# Patient Record
Sex: Female | Born: 1940 | Race: Black or African American | Hispanic: No | Smoking: Never smoker
Health system: Southern US, Community
[De-identification: ages and names within clinical notes are randomized; demographics above are authoritative.]

## PROBLEM LIST (undated history)

## (undated) DIAGNOSIS — I10 Essential (primary) hypertension: Secondary | ICD-10-CM

## (undated) HISTORY — PX: BREAST LUMPECTOMY: SHX2

---

## 2013-09-26 ENCOUNTER — Encounter (HOSPITAL_COMMUNITY): Payer: Self-pay | Admitting: Emergency Medicine

## 2013-09-26 ENCOUNTER — Emergency Department (HOSPITAL_COMMUNITY): Payer: Self-pay

## 2013-09-26 ENCOUNTER — Emergency Department (HOSPITAL_COMMUNITY)
Admission: EM | Admit: 2013-09-26 | Discharge: 2013-09-27 | Disposition: A | Discharge status: Home / Self Care | Payer: Self-pay | Attending: Emergency Medicine | Admitting: Emergency Medicine

## 2013-09-26 DIAGNOSIS — Y9289 Other specified places as the place of occurrence of the external cause: Secondary | ICD-10-CM | POA: Insufficient documentation

## 2013-09-26 DIAGNOSIS — S060X1A Concussion with loss of consciousness of 30 minutes or less, initial encounter: Secondary | ICD-10-CM | POA: Insufficient documentation

## 2013-09-26 DIAGNOSIS — I10 Essential (primary) hypertension: Secondary | ICD-10-CM | POA: Insufficient documentation

## 2013-09-26 DIAGNOSIS — W010XXA Fall on same level from slipping, tripping and stumbling without subsequent striking against object, initial encounter: Secondary | ICD-10-CM | POA: Insufficient documentation

## 2013-09-26 DIAGNOSIS — S0990XA Unspecified injury of head, initial encounter: Secondary | ICD-10-CM | POA: Insufficient documentation

## 2013-09-26 DIAGNOSIS — Y9301 Activity, walking, marching and hiking: Secondary | ICD-10-CM | POA: Insufficient documentation

## 2013-09-26 DIAGNOSIS — S0993XA Unspecified injury of face, initial encounter: Secondary | ICD-10-CM | POA: Insufficient documentation

## 2013-09-26 DIAGNOSIS — S199XXA Unspecified injury of neck, initial encounter: Secondary | ICD-10-CM

## 2013-09-26 DIAGNOSIS — IMO0002 Reserved for concepts with insufficient information to code with codable children: Secondary | ICD-10-CM | POA: Insufficient documentation

## 2013-09-26 HISTORY — DX: Essential (primary) hypertension: I10

## 2013-09-26 LAB — CBC WITH DIFFERENTIAL/PLATELET
BASOS PCT: 0 % (ref 0–1)
Basophils Absolute: 0 10*3/uL (ref 0.0–0.1)
Eosinophils Absolute: 0 10*3/uL (ref 0.0–0.7)
Eosinophils Relative: 0 % (ref 0–5)
HCT: 36.3 % (ref 36.0–46.0)
Hemoglobin: 11.7 g/dL — ABNORMAL LOW (ref 12.0–15.0)
LYMPHS PCT: 16 % (ref 12–46)
Lymphs Abs: 2.3 10*3/uL (ref 0.7–4.0)
MCH: 26.7 pg (ref 26.0–34.0)
MCHC: 32.2 g/dL (ref 30.0–36.0)
MCV: 82.9 fL (ref 78.0–100.0)
MONO ABS: 0.8 10*3/uL (ref 0.1–1.0)
MONOS PCT: 6 % (ref 3–12)
NEUTROS ABS: 11.2 10*3/uL — AB (ref 1.7–7.7)
Neutrophils Relative %: 78 % — ABNORMAL HIGH (ref 43–77)
Platelets: 299 10*3/uL (ref 150–400)
RBC: 4.38 MIL/uL (ref 3.87–5.11)
RDW: 15.4 % (ref 11.5–15.5)
WBC: 14.4 10*3/uL — ABNORMAL HIGH (ref 4.0–10.5)

## 2013-09-26 LAB — COMPREHENSIVE METABOLIC PANEL
ALT: 14 U/L (ref 0–35)
ANION GAP: 16 — AB (ref 5–15)
AST: 17 U/L (ref 0–37)
Albumin: 4.1 g/dL (ref 3.5–5.2)
Alkaline Phosphatase: 92 U/L (ref 39–117)
BILIRUBIN TOTAL: 0.2 mg/dL — AB (ref 0.3–1.2)
BUN: 12 mg/dL (ref 6–23)
CHLORIDE: 100 meq/L (ref 96–112)
CO2: 25 meq/L (ref 19–32)
CREATININE: 0.71 mg/dL (ref 0.50–1.10)
Calcium: 9.7 mg/dL (ref 8.4–10.5)
GFR calc Af Amer: >90 mL/min (ref >90)
GFR, EST NON AFRICAN AMERICAN: 84 mL/min — AB (ref >90)
Glucose, Bld: 106 mg/dL — ABNORMAL HIGH (ref 70–99)
Potassium: 3 mEq/L — ABNORMAL LOW (ref 3.7–5.3)
Sodium: 141 mEq/L (ref 137–147)
Total Protein: 7.5 g/dL (ref 6.0–8.3)

## 2013-09-26 LAB — I-STAT TROPONIN, ED: Troponin i, poc: 0 ng/mL (ref 0.00–0.08)

## 2013-09-26 LAB — URINALYSIS, ROUTINE W REFLEX MICROSCOPIC
BILIRUBIN URINE: NEGATIVE
Glucose, UA: NEGATIVE mg/dL
HGB URINE DIPSTICK: NEGATIVE
KETONES UR: NEGATIVE mg/dL
NITRITE: NEGATIVE
Protein, ur: NEGATIVE mg/dL
Specific Gravity, Urine: 1.011 (ref 1.005–1.030)
UROBILINOGEN UA: 0.2 mg/dL (ref 0.0–1.0)
pH: 7 (ref 5.0–8.0)

## 2013-09-26 LAB — URINE MICROSCOPIC-ADD ON

## 2013-09-26 LAB — POC OCCULT BLOOD, ED: FECAL OCCULT BLD: NEGATIVE

## 2013-09-26 LAB — CBG MONITORING, ED: Glucose-Capillary: 101 mg/dL — ABNORMAL HIGH (ref 70–99)

## 2013-09-26 MED ORDER — ACETAMINOPHEN 325 MG PO TABS
650.0000 mg | ORAL_TABLET | Freq: Once | ORAL | Status: AC
  Administered 2013-09-26: 650 mg via ORAL
  Filled 2013-09-26: qty 2

## 2013-09-26 MED ORDER — POTASSIUM CHLORIDE CRYS ER 20 MEQ PO TBCR
40.0000 meq | EXTENDED_RELEASE_TABLET | Freq: Once | ORAL | Status: AC
  Administered 2013-09-26: 40 meq via ORAL
  Filled 2013-09-26: qty 2

## 2013-09-26 NOTE — ED Provider Notes (Signed)
  Physical Exam  BP 140/68  Pulse 68  Temp(Src) 98.7 F (37.1 C) (Oral)  Resp 18  SpO2 96%  Physical Exam  ED Course  Procedures  MDM Patient with mechanical fall, followed by syncope. She is back to her baseline now. Does have had trauma. Imaging pending at this time.      Juliet RudeNathan R. Rubin PayorPickering, MD 09/26/13 [phone]

## 2013-09-26 NOTE — ED Notes (Signed)
Patient transported to X-ray 

## 2013-09-26 NOTE — ED Provider Notes (Signed)
CSN: [id number]     Arrival date & time 09/26/13  1504 History   None    Chief Complaint  Patient presents with  . Fall  . Loss of Consciousness   Pam Miller is a 73 yo AAF w/PMH of HTN who presents today after mechanical fall and subsequent syncope. Patient lives outside walking with her daughter when she tripped over the curb fell onto her knees and struck her head on the cement. Patient did not lose consciousness at that point that when daughter helped her to stand and lean against a pillar she then syncopized. Patient believes she was only out for a couple of seconds. She felt very nauseous and sweaty after she fell the second time. Vomited once. EMS was called in route she had fecal incontinence. She now has knee pain as well as left forehead pain and headache. Lost part of her tooth in her dentures, did not swallow. Fragment was handed to daughter.   She denies CP, SOB, fever, chills, constipation, hematemesis, dysuria, hematuria, sick contacts, or recent illness or travel.   (Consider location/radiation/quality/duration/timing/severity/associated sxs/prior Treatment) Patient is a 73 y.o. female presenting with head injury. The history is provided by the patient.  Head Injury Location:  Frontal Mechanism of injury: fall   Pain details:    Quality:  Aching   Severity:  Moderate   Timing:  Constant   Progression:  Unchanged Chronicity:  New Associated symptoms: headache, loss of consciousness, nausea, neck pain and vomiting   Associated symptoms: no blurred vision, no difficulty breathing, no disorientation, no double vision and no focal weakness     Past Medical History  Diagnosis Date  . Hypertension    Past Surgical History  Procedure Laterality Date  . Breast lumpectomy     No family history on file. History  Substance Use Topics  . Smoking status: Never Smoker   . Smokeless tobacco: Not on file  . Alcohol Use: No   OB History   Grav Para Term Preterm Abortions  TAB SAB Ect Mult Living                 Review of Systems  Constitutional: Negative for fever and chills.  HENT: Positive for dental problem (lost tooth in dentures). Negative for nosebleeds.   Eyes: Negative for blurred vision and double vision.  Respiratory: Negative for shortness of breath.   Cardiovascular: Negative for chest pain, palpitations and leg swelling.  Gastrointestinal: Positive for nausea and vomiting. Negative for abdominal pain, diarrhea, constipation and abdominal distention.  Genitourinary: Negative for dysuria, frequency, flank pain and decreased urine volume.  Musculoskeletal: Positive for arthralgias (knees) and neck pain. Negative for back pain.  Skin: Positive for wound (forehead bleeding, knees hurt).  Neurological: Positive for loss of consciousness and headaches. Negative for dizziness, focal weakness, speech difficulty and light-headedness.  All other systems reviewed and are negative.     Allergies  Review of patient's allergies indicates not on file.  Home Medications   Prior to Admission medications   Not on File   BP 140/68  Pulse 68  Temp(Src) 98.7 F (37.1 C) (Oral)  Resp 18  SpO2 96% Physical Exam  Nursing note and vitals reviewed. Constitutional: She is oriented to person, place, and time. She appears well-developed and well-nourished. No distress.  HENT:  Head: Normocephalic and atraumatic.  Eyes: EOM are normal. Pupils are equal, round, and reactive to light.  Cardiovascular: Normal rate, regular rhythm, normal heart sounds and intact distal pulses.  Exam reveals no gallop and no friction rub.   No murmur heard. Pulmonary/Chest: Effort normal and breath sounds normal. No respiratory distress. She has no wheezes. She has no rales. She exhibits no tenderness.  Abdominal: Soft. Bowel sounds are normal. She exhibits no distension and no mass. There is no tenderness. There is no rebound and no guarding.  Musculoskeletal: She exhibits  tenderness (over both knees, small abrasion over left knee).  Lymphadenopathy:    She has no cervical adenopathy.  Neurological: She is alert and oriented to person, place, and time. No cranial nerve deficit. Coordination normal.  Skin: Skin is warm and dry. She is not diaphoretic.    ED Course  Procedures (including critical care time) Labs Review Labs Reviewed  URINALYSIS, ROUTINE W REFLEX MICROSCOPIC - Abnormal; Notable for the following:    Leukocytes, UA TRACE (*)    All other components within normal limits  CBC WITH DIFFERENTIAL - Abnormal; Notable for the following:    WBC 14.4 (*)    Hemoglobin 11.7 (*)    Neutrophils Relative % 78 (*)    Neutro Abs 11.2 (*)    All other components within normal limits  COMPREHENSIVE METABOLIC PANEL - Abnormal; Notable for the following:    Potassium 3.0 (*)    Glucose, Bld 106 (*)    Total Bilirubin 0.2 (*)    GFR calc non Af Amer 84 (*)    Anion gap 16 (*)    All other components within normal limits  CBG MONITORING, ED - Abnormal; Notable for the following:    Glucose-Capillary 101 (*)    All other components within normal limits  URINE MICROSCOPIC-ADD ON  POCT CBG (FASTING - GLUCOSE)-MANUAL ENTRY  I-STAT TROPOININ, ED  POC OCCULT BLOOD, ED    Imaging Review Dg Chest 1 View  09/26/2013   CLINICAL DATA:  Syncope.  EXAM: CHEST - 1 VIEW  COMPARISON:  None.  FINDINGS: Heart is borderline in size. Mediastinal contours are within normal limits. Lungs are clear. No effusions or acute bony abnormality.  IMPRESSION: No active disease.   Electronically Signed   By: Charlett NoseKevin  Dover M.D.   On: 09/26/2013 17:12   Dg Knee 2 Views Left  09/26/2013   CLINICAL DATA:  Evaluate left knee.  Fall.  Knee pain.  EXAM: LEFT KNEE - 1-2 VIEW  COMPARISON:  None.  FINDINGS: Moderate tricompartment degenerative changes with joint space narrowing and spurring. No acute bony abnormality. Specifically, no fracture, subluxation, or dislocation. Soft tissues are  intact. No joint effusion.  IMPRESSION: Osteoarthritis.  No acute findings.   Electronically Signed   By: Charlett NoseKevin  Dover M.D.   On: 09/26/2013 17:20   Dg Knee 2 Views Right  09/26/2013   CLINICAL DATA:  Status post fall.  Right knee pain.  EXAM: RIGHT KNEE - 1-2 VIEW  COMPARISON:  None.  FINDINGS: There is no acute bony or joint abnormality. Advanced tricompartmental osteoarthritis appears worst in medial compartment. There is no joint effusion.  IMPRESSION: No acute finding.  Advanced tricompartmental degenerative disease.   Electronically Signed   By: Drusilla Kannerhomas  Dalessio M.D.   On: 09/26/2013 17:14   Ct Head Wo Contrast  09/26/2013   CLINICAL DATA:  Syncopal episode. Abrasion to left frontal region. Headache.  EXAM: CT HEAD WITHOUT CONTRAST  CT CERVICAL SPINE WITHOUT CONTRAST  TECHNIQUE: Multidetector CT imaging of the head and cervical spine was performed following the standard protocol without intravenous contrast. Multiplanar CT image reconstructions of the cervical  spine were also generated.  COMPARISON:  None.  FINDINGS: CT HEAD FINDINGS  Sinuses/Soft tissues: Mild left supraorbital/frontal scalp soft tissue swelling.  Clear paranasal sinuses and mastoid air cells. No underlying skull fracture.  Intracranial: No mass lesion, hemorrhage, hydrocephalus, acute infarct, intra-axial, or extra-axial fluid collection.  CT CERVICAL SPINE FINDINGS  Spinal visualization through the bottom of T2. Prevertebral soft tissues are within normal limits. No apical pneumothorax.  Multilevel spondylosis. Bilateral foraminal narrowing at C5-6, C6-7. Bilateral carotid atherosclerosis.  Skull base intact. Maintenance of vertebral body height. Straightening and mild reversal of expected cervical lordosis, centered about the C5-6 level. Prominent endplate osteophytes at this level. Facets are well-aligned. Coronal reformats demonstrate a normal C1-C2 articulation.  IMPRESSION: 1. No acute intracranial abnormality. Soft tissue  swelling about the left frontal/supraorbital region. 2. Cervical spondylosis, without acute fracture or subluxation. 3. Straightening and mild reversal of expected cervical lordosis could be positional, due to muscular spasm, or ligamentous injury.   Electronically Signed   By: Jeronimo Greaves M.D.   On: 09/26/2013 16:54   Ct Cervical Spine Wo Contrast  09/26/2013   CLINICAL DATA:  Syncopal episode. Abrasion to left frontal region. Headache.  EXAM: CT HEAD WITHOUT CONTRAST  CT CERVICAL SPINE WITHOUT CONTRAST  TECHNIQUE: Multidetector CT imaging of the head and cervical spine was performed following the standard protocol without intravenous contrast. Multiplanar CT image reconstructions of the cervical spine were also generated.  COMPARISON:  None.  FINDINGS: CT HEAD FINDINGS  Sinuses/Soft tissues: Mild left supraorbital/frontal scalp soft tissue swelling.  Clear paranasal sinuses and mastoid air cells. No underlying skull fracture.  Intracranial: No mass lesion, hemorrhage, hydrocephalus, acute infarct, intra-axial, or extra-axial fluid collection.  CT CERVICAL SPINE FINDINGS  Spinal visualization through the bottom of T2. Prevertebral soft tissues are within normal limits. No apical pneumothorax.  Multilevel spondylosis. Bilateral foraminal narrowing at C5-6, C6-7. Bilateral carotid atherosclerosis.  Skull base intact. Maintenance of vertebral body height. Straightening and mild reversal of expected cervical lordosis, centered about the C5-6 level. Prominent endplate osteophytes at this level. Facets are well-aligned. Coronal reformats demonstrate a normal C1-C2 articulation.  IMPRESSION: 1. No acute intracranial abnormality. Soft tissue swelling about the left frontal/supraorbital region. 2. Cervical spondylosis, without acute fracture or subluxation. 3. Straightening and mild reversal of expected cervical lordosis could be positional, due to muscular spasm, or ligamentous injury.   Electronically Signed   By:  Jeronimo Greaves M.D.   On: 09/26/2013 16:54     EKG Interpretation   Date/Time:  Saturday September 26 2013 15:33:27 EDT Ventricular Rate:  70 PR Interval:  142 QRS Duration: 140 QT Interval:  455 QTC Calculation: 491 R Axis:   72 Text Interpretation:  Sinus rhythm Right bundle branch block Confirmed by  Rubin Payor  MD, Harrold Donath [phone]) on 09/26/2013 5:20:57 PM      MDM   73 yo AAF w/mechanical fall and subsequent syncope. Please see HPI for details. On exam, pt in NAD, AFVSS. Abrasion to left frontal forehead, no active bleeding. No obvious swelling or injuries to the face. Missing a tooth fragment from her top dentures. No other oral injury. Small abrasion to left knee. Tenderness to both knees bilaterally. Will obtain CT head and neck, x-ray is a bilateral knees as well as chest x-ray, CBC, CMP, troponin, Hemoccult, UA, and EKG.  Labs wnl except for K+ at 3.0. Given Kdur 40mg . Imaging negative for spine or head abnormality. Knees and chest w/no fracture or injury.   HA treated  w/tylenol w/improvement. Stable for DC home w/follow up on TR w/PCP. Encouraged her to continue K+ replacement and tell her PCP to recheck at visit. Strict return precautions include worst HA of life, focal neural deficits, or slurred speech or numbness/tingling.   Final diagnoses:  Concussion, with loss of consciousness of 30 minutes or less, initial encounter    Pt was seen under the supervision of Dr. Rubin Payor.     Rachelle Hora, MD 09/26/13 [phone]

## 2013-09-26 NOTE — Discharge Instructions (Signed)
Concussion  A concussion, or closed-head injury, is a brain injury caused by a direct blow to the head or by a quick and sudden movement (jolt) of the head or neck. Concussions are usually not life-threatening. Even so, the effects of a concussion can be serious. If you have had a concussion before, you are more likely to experience concussion-like symptoms after a direct blow to the head.   CAUSES  · Direct blow to the head, such as from running into another player during a soccer game, being hit in a fight, or hitting your head on a hard surface.  · A jolt of the head or neck that causes the brain to move back and forth inside the skull, such as in a car crash.  SIGNS AND SYMPTOMS  The signs of a concussion can be hard to notice. Early on, they may be missed by you, family members, and health care providers. You may look fine but act or feel differently.  Symptoms are usually temporary, but they may last for days, weeks, or even longer. Some symptoms may appear right away while others may not show up for hours or days. Every head injury is different. Symptoms include:  · Mild to moderate headaches that will not go away.  · A feeling of pressure inside your head.  · Having more trouble than usual:  ¨ Learning or remembering things you have heard.  ¨ Answering questions.  ¨ Paying attention or concentrating.  ¨ Organizing daily tasks.  ¨ Making decisions and solving problems.  · Slowness in thinking, acting or reacting, speaking, or reading.  · Getting lost or being easily confused.  · Feeling tired all the time or lacking energy (fatigued).  · Feeling drowsy.  · Sleep disturbances.  ¨ Sleeping more than usual.  ¨ Sleeping less than usual.  ¨ Trouble falling asleep.  ¨ Trouble sleeping (insomnia).  · Loss of balance or feeling lightheaded or dizzy.  · Nausea or vomiting.  · Numbness or tingling.  · Increased sensitivity to:  ¨ Sounds.  ¨ Lights.  ¨ Distractions.  · Vision problems or eyes that tire  easily.  · Diminished sense of taste or smell.  · Ringing in the ears.  · Mood changes such as feeling sad or anxious.  · Becoming easily irritated or angry for little or no reason.  · Lack of motivation.  · Seeing or hearing things other people do not see or hear (hallucinations).  DIAGNOSIS  Your health care provider can usually diagnose a concussion based on a description of your injury and symptoms. He or she will ask whether you passed out (lost consciousness) and whether you are having trouble remembering events that happened right before and during your injury.  Your evaluation might include:  · A brain scan to look for signs of injury to the brain. Even if the test shows no injury, you may still have a concussion.  · Blood tests to be sure other problems are not present.  TREATMENT  · Concussions are usually treated in an emergency department, in urgent care, or at a clinic. You may need to stay in the hospital overnight for further treatment.  · Tell your health care provider if you are taking any medicines, including prescription medicines, over-the-counter medicines, and natural remedies. Some medicines, such as blood thinners (anticoagulants) and aspirin, may increase the chance of complications. Also tell your health care provider whether you have had alcohol or are taking illegal drugs. This information   may affect treatment.  · Your health care provider will send you home with important instructions to follow.  · How fast you will recover from a concussion depends on many factors. These factors include how severe your concussion is, what part of your brain was injured, your age, and how healthy you were before the concussion.  · Most people with mild injuries recover fully. Recovery can take time. In general, recovery is slower in older persons. Also, persons who have had a concussion in the past or have other medical problems may find that it takes longer to recover from their current injury.  HOME  CARE INSTRUCTIONS  General Instructions  · Carefully follow the directions your health care provider gave you.  · Only take over-the-counter or prescription medicines for pain, discomfort, or fever as directed by your health care provider.  · Take only those medicines that your health care provider has approved.  · Do not drink alcohol until your health care provider says you are well enough to do so. Alcohol and certain other drugs may slow your recovery and can put you at risk of further injury.  · If it is harder than usual to remember things, write them down.  · If you are easily distracted, try to do one thing at a time. For example, do not try to watch TV while fixing dinner.  · Talk with family members or close friends when making important decisions.  · Keep all follow-up appointments. Repeated evaluation of your symptoms is recommended for your recovery.  · Watch your symptoms and tell others to do the same. Complications sometimes occur after a concussion. Older adults with a brain injury may have a higher risk of serious complications, such as a blood clot on the brain.  · Tell your teachers, school nurse, school counselor, coach, athletic trainer, or work manager about your injury, symptoms, and restrictions. Tell them about what you can or cannot do. They should watch for:  ¨ Increased problems with attention or concentration.  ¨ Increased difficulty remembering or learning new information.  ¨ Increased time needed to complete tasks or assignments.  ¨ Increased irritability or decreased ability to cope with stress.  ¨ Increased symptoms.  · Rest. Rest helps the brain to heal. Make sure you:  ¨ Get plenty of sleep at night. Avoid staying up late at night.  ¨ Keep the same bedtime hours on weekends and weekdays.  ¨ Rest during the day. Take daytime naps or rest breaks when you feel tired.  · Limit activities that require a lot of thought or concentration. These include:  ¨ Doing homework or job-related  work.  ¨ Watching TV.  ¨ Working on the computer.  · Avoid any situation where there is potential for another head injury (football, hockey, soccer, basketball, martial arts, downhill snow sports and horseback riding). Your condition will get worse every time you experience a concussion. You should avoid these activities until you are evaluated by the appropriate follow-up health care providers.  Returning To Your Regular Activities  You will need to return to your normal activities slowly, not all at once. You must give your body and brain enough time for recovery.  · Do not return to sports or other athletic activities until your health care provider tells you it is safe to do so.  · Ask your health care provider when you can drive, ride a bicycle, or operate heavy machinery. Your ability to react may be slower after a   brain injury. Never do these activities if you are dizzy.  · Ask your health care provider about when you can return to work or school.  Preventing Another Concussion  It is very important to avoid another brain injury, especially before you have recovered. In rare cases, another injury can lead to permanent brain damage, brain swelling, or death. The risk of this is greatest during the first 7-10 days after a head injury. Avoid injuries by:  · Wearing a seat belt when riding in a car.  · Drinking alcohol only in moderation.  · Wearing a helmet when biking, skiing, skateboarding, skating, or doing similar activities.  · Avoiding activities that could lead to a second concussion, such as contact or recreational sports, until your health care provider says it is okay.  · Taking safety measures in your home.  ¨ Remove clutter and tripping hazards from floors and stairways.  ¨ Use grab bars in bathrooms and handrails by stairs.  ¨ Place non-slip mats on floors and in bathtubs.  ¨ Improve lighting in dim areas.  SEEK MEDICAL CARE IF:  · You have increased problems paying attention or  concentrating.  · You have increased difficulty remembering or learning new information.  · You need more time to complete tasks or assignments than before.  · You have increased irritability or decreased ability to cope with stress.  · You have more symptoms than before.  Seek medical care if you have any of the following symptoms for more than 2 weeks after your injury:  · Lasting (chronic) headaches.  · Dizziness or balance problems.  · Nausea.  · Vision problems.  · Increased sensitivity to noise or light.  · Depression or mood swings.  · Anxiety or irritability.  · Memory problems.  · Difficulty concentrating or paying attention.  · Sleep problems.  · Feeling tired all the time.  SEEK IMMEDIATE MEDICAL CARE IF:  · You have severe or worsening headaches. These may be a sign of a blood clot in the brain.  · You have weakness (even if only in one hand, leg, or part of the face).  · You have numbness.  · You have decreased coordination.  · You vomit repeatedly.  · You have increased sleepiness.  · One pupil is larger than the other.  · You have convulsions.  · You have slurred speech.  · You have increased confusion. This may be a sign of a blood clot in the brain.  · You have increased restlessness, agitation, or irritability.  · You are unable to recognize people or places.  · You have neck pain.  · It is difficult to wake you up.  · You have unusual behavior changes.  · You lose consciousness.  MAKE SURE YOU:  · Understand these instructions.  · Will watch your condition.  · Will get help right away if you are not doing well or get worse.  Document Released: 04/21/2003 Document Revised: 02/03/2013 Document Reviewed: 08/21/2012  [employer]® Patient Information ©2015 [employer]. This information is not intended to replace advice given to you by your health care provider. Make sure you discuss any questions you have with your health care provider.

## 2013-09-26 NOTE — ED Notes (Signed)
To ED via [facility] after falling, hit head on left forehead/temporal area-- hematoma noted. Abrasion at site. After tripping, pt walked to a wall that was nearby and sat up against it-- passed out and vomited per daughter who was a witness. EMS attempted to stand pt -- pt had another syncopal episode.  On arrival to ED -- pt incontinent of urine and large amount of stool. Able to roll and lift self to assist with ADLs. Pt is alert, oriented x4,

## 2013-09-26 NOTE — ED Notes (Signed)
To ED via [facility] with c/o syncopal episode after falling on concrete and hitting head.

## 2013-09-27 NOTE — ED Provider Notes (Signed)
I saw and evaluated the patient, reviewed the resident's note and I agree with the findings and plan.   EKG Interpretation   Date/Time:  Saturday September 26 2013 15:33:27 EDT Ventricular Rate:  70 PR Interval:  142 QRS Duration: 140 QT Interval:  455 QTC Calculation: 491 R Axis:   72 Text Interpretation:  Sinus rhythm Right bundle branch block Confirmed by  Rubin PayorPICKERING  MD, Jakeob Tullis [phone]) on 09/26/2013 5:20:57 PM       Juliet RudeNathan R. Rubin PayorPickering, MD 09/27/13 [id number]

## 2015-04-05 IMAGING — CT CT CERVICAL SPINE W/O CM
3 of 6 series · 10 of 33 positions shown, 12 images · non-contrast
Comparison: None.

CLINICAL DATA: Syncopal episode. Abrasion to left frontal region.
Headache.

EXAM:
CT HEAD WITHOUT CONTRAST
CT CERVICAL SPINE WITHOUT CONTRAST
TECHNIQUE: Multidetector CT imaging of the head and cervical spine was
performed following the standard protocol without intravenous
contrast. Multiplanar CT image reconstructions of the cervical spine
were also generated.

[Series 307: orthog axials · axial · 0.39mm/px · z∈[-102,-60]mm · 2 of 75 slices shown, 3 images]
[im 25/75  soft-tissue]
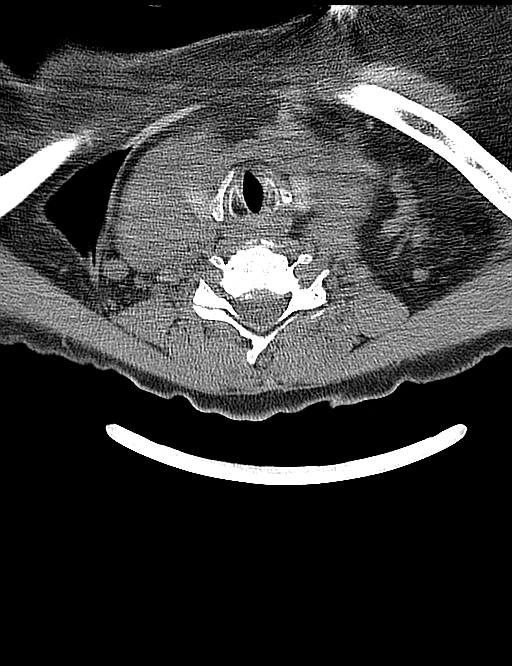
[im 25/75  bone]
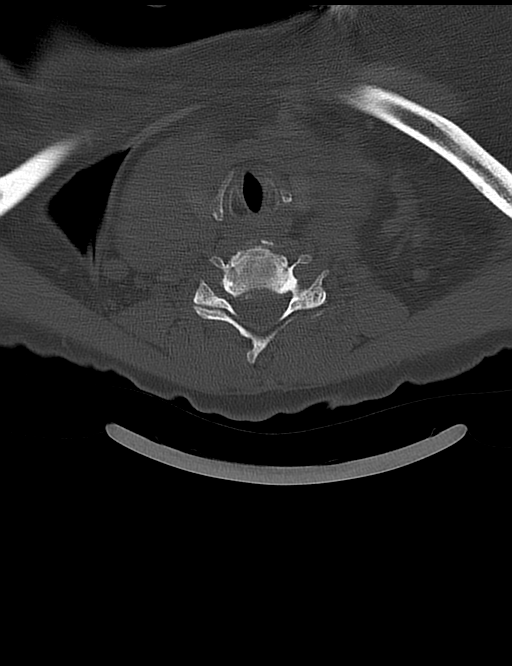
[im 50/75  bone]
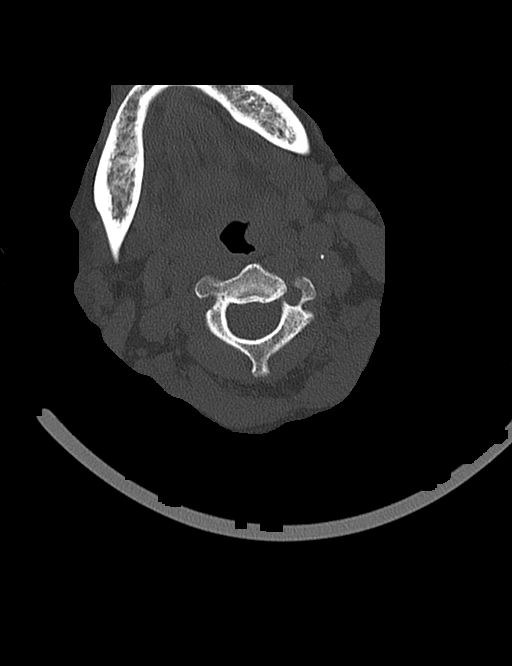

[Series 308: coronal cspine · coronal · 0.39mm/px · 3 of 47 slices shown]
[im 10/47  bone]
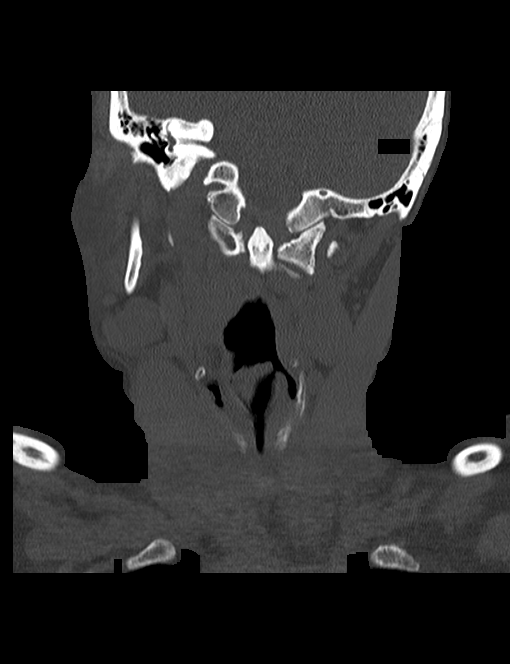
[im 19/47  bone]
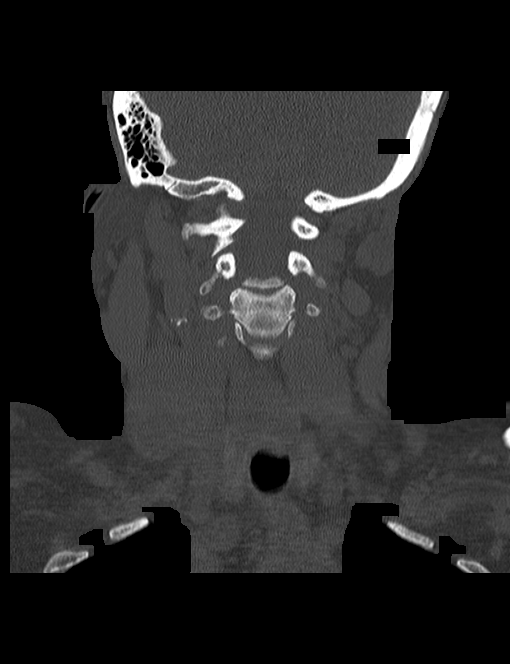
[im 28/47  bone]
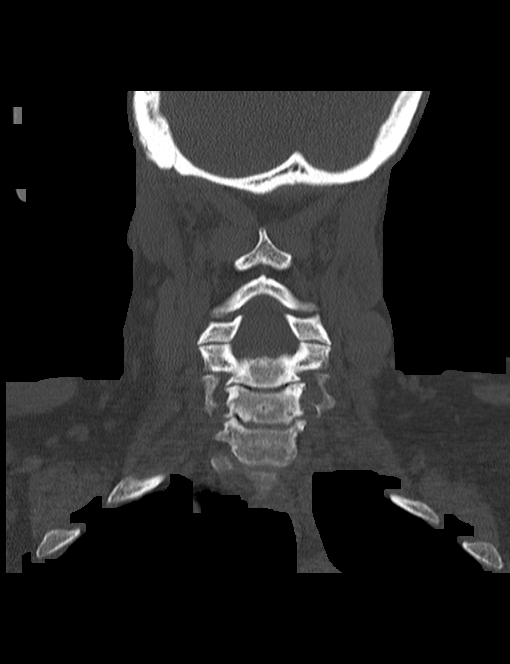

[Series 309: sagittal cspine · sagittal · 0.39mm/px · 5 of 34 slices shown, 6 images]
[im 12/34  bone]
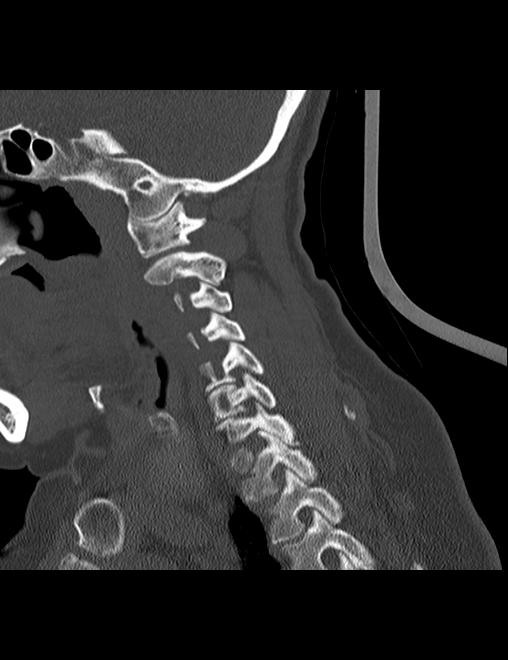
[im 14/34  bone]
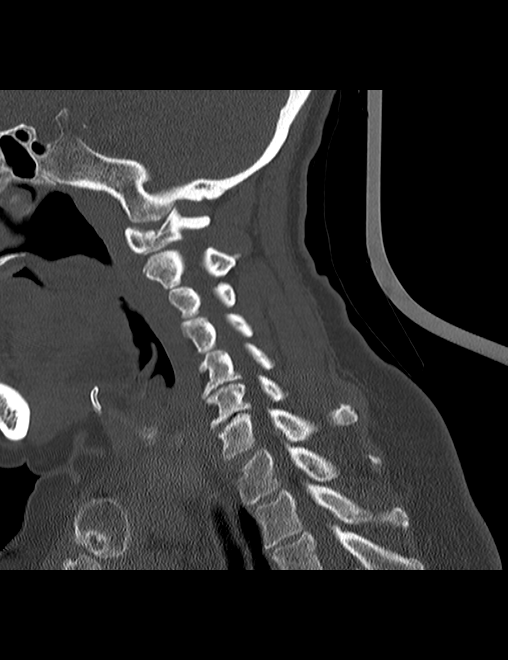
[im 17/34  soft-tissue]
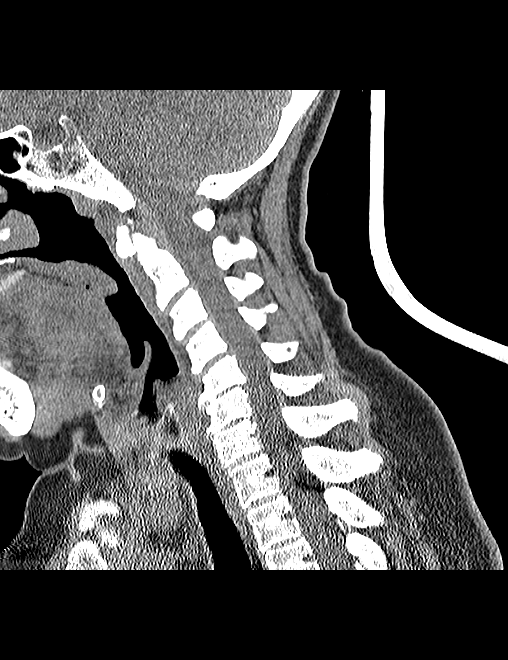
[im 17/34  bone]
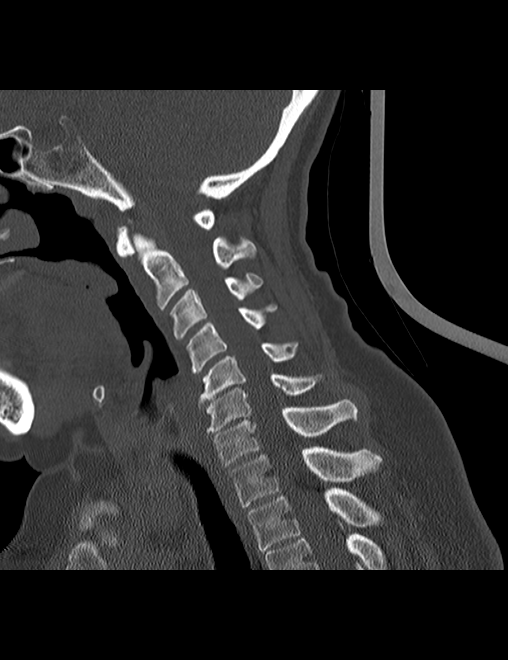
[im 20/34  bone]
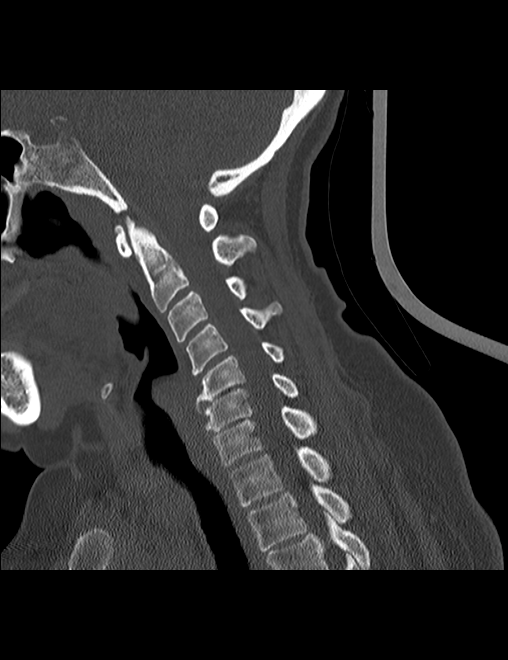
[im 23/34  bone]
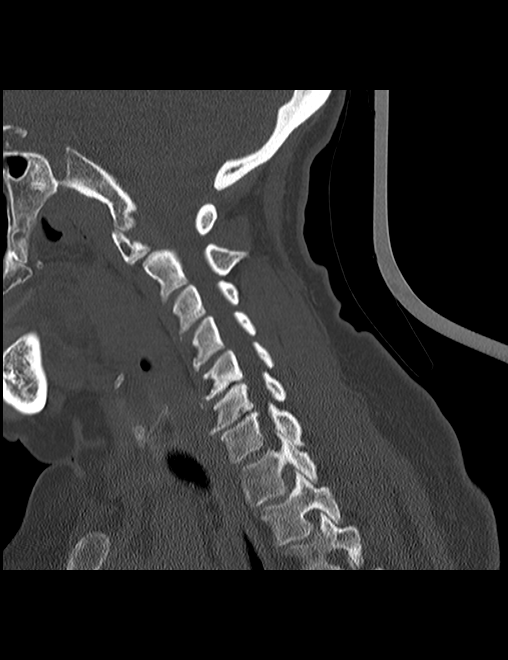

[10 of 33 positions shown; findings below may reference images not displayed]

FINDINGS: CT HEAD FINDINGS

Sinuses/Soft tissues: Mild left supraorbital/frontal scalp soft
tissue swelling.

Clear paranasal sinuses and mastoid air cells. No underlying skull
fracture.

Intracranial: No mass lesion, hemorrhage, hydrocephalus, acute
infarct, intra-axial, or extra-axial fluid collection.

CT CERVICAL SPINE FINDINGS

Spinal visualization through the bottom of T2. Prevertebral soft
tissues are within normal limits. No apical pneumothorax.

Multilevel spondylosis. Bilateral foraminal narrowing at C5-6, C6-7.
Bilateral carotid atherosclerosis.

Skull base intact. Maintenance of vertebral body height.
Straightening and mild reversal of expected cervical lordosis,
centered about the C5-6 level. Prominent endplate osteophytes at
this level. Facets are well-aligned. Coronal reformats demonstrate a
normal C1-C2 articulation..
IMPRESSION: 1. No acute intracranial abnormality. Soft tissue swelling about the
left frontal/supraorbital region.
2. Cervical spondylosis, without acute fracture or subluxation.
3. Straightening and mild reversal of expected cervical lordosis
could be positional, due to muscular spasm, or ligamentous injury.

## 2023-01-13 DEATH — deceased
